# Patient Record
Sex: Female | Born: 1955 | Race: White | Hispanic: No | Marital: Married | Smoking: Former smoker
Health system: Southern US, Community
[De-identification: ages and names within clinical notes are randomized; demographics above are authoritative.]

## PROBLEM LIST (undated history)

## (undated) DIAGNOSIS — M5137 Other intervertebral disc degeneration, lumbosacral region: Secondary | ICD-10-CM

## (undated) DIAGNOSIS — I10 Essential (primary) hypertension: Secondary | ICD-10-CM

## (undated) DIAGNOSIS — G629 Polyneuropathy, unspecified: Secondary | ICD-10-CM

## (undated) DIAGNOSIS — C9590 Leukemia, unspecified not having achieved remission: Secondary | ICD-10-CM

## (undated) DIAGNOSIS — E119 Type 2 diabetes mellitus without complications: Secondary | ICD-10-CM

## (undated) HISTORY — PX: APPENDECTOMY: SHX54

## (undated) HISTORY — PX: BREAST SURGERY: SHX581

---

## 2019-07-27 ENCOUNTER — Other Ambulatory Visit: Payer: Self-pay

## 2019-07-27 ENCOUNTER — Emergency Department (HOSPITAL_COMMUNITY)
Admission: EM | Admit: 2019-07-27 | Discharge: 2019-07-27 | Disposition: A | Discharge status: Home / Self Care | Payer: No Typology Code available for payment source | Attending: Emergency Medicine | Admitting: Emergency Medicine

## 2019-07-27 ENCOUNTER — Encounter (HOSPITAL_COMMUNITY): Payer: Self-pay | Admitting: Emergency Medicine

## 2019-07-27 ENCOUNTER — Emergency Department (HOSPITAL_COMMUNITY): Payer: No Typology Code available for payment source

## 2019-07-27 DIAGNOSIS — I10 Essential (primary) hypertension: Secondary | ICD-10-CM | POA: Diagnosis not present

## 2019-07-27 DIAGNOSIS — E119 Type 2 diabetes mellitus without complications: Secondary | ICD-10-CM | POA: Insufficient documentation

## 2019-07-27 DIAGNOSIS — C9190 Lymphoid leukemia, unspecified not having achieved remission: Secondary | ICD-10-CM | POA: Diagnosis not present

## 2019-07-27 DIAGNOSIS — Y9389 Activity, other specified: Secondary | ICD-10-CM | POA: Diagnosis not present

## 2019-07-27 DIAGNOSIS — Y92410 Unspecified street and highway as the place of occurrence of the external cause: Secondary | ICD-10-CM | POA: Diagnosis not present

## 2019-07-27 DIAGNOSIS — Y999 Unspecified external cause status: Secondary | ICD-10-CM | POA: Diagnosis not present

## 2019-07-27 DIAGNOSIS — Z87891 Personal history of nicotine dependence: Secondary | ICD-10-CM | POA: Insufficient documentation

## 2019-07-27 DIAGNOSIS — R079 Chest pain, unspecified: Secondary | ICD-10-CM | POA: Insufficient documentation

## 2019-07-27 HISTORY — DX: Essential (primary) hypertension: I10

## 2019-07-27 HISTORY — DX: Type 2 diabetes mellitus without complications: E11.9

## 2019-07-27 HISTORY — DX: Leukemia, unspecified not having achieved remission: C95.90

## 2019-07-27 HISTORY — DX: Other intervertebral disc degeneration, lumbosacral region: M51.37

## 2019-07-27 HISTORY — DX: Polyneuropathy, unspecified: G62.9

## 2019-07-27 LAB — COMPREHENSIVE METABOLIC PANEL
ALT: 23 U/L (ref 0–44)
AST: 25 U/L (ref 15–41)
Albumin: 4.4 g/dL (ref 3.5–5.0)
Alkaline Phosphatase: 106 U/L (ref 38–126)
Anion gap: 15 (ref 5–15)
BUN: 9 mg/dL (ref 8–23)
CO2: 26 mmol/L (ref 22–32)
Calcium: 10.6 mg/dL — ABNORMAL HIGH (ref 8.9–10.3)
Chloride: 100 mmol/L (ref 98–111)
Creatinine, Ser: 0.75 mg/dL (ref 0.44–1.00)
GFR calc Af Amer: >60 mL/min (ref >60)
GFR calc non Af Amer: >60 mL/min (ref >60)
Glucose, Bld: 141 mg/dL — ABNORMAL HIGH (ref 70–99)
Potassium: 4.7 mmol/L (ref 3.5–5.1)
Sodium: 141 mmol/L (ref 135–145)
Total Bilirubin: 0.5 mg/dL (ref 0.3–1.2)
Total Protein: 7 g/dL (ref 6.5–8.1)

## 2019-07-27 LAB — CBC WITH DIFFERENTIAL/PLATELET
Abs Immature Granulocytes: 0.11 10*3/uL — ABNORMAL HIGH (ref 0.00–0.07)
Basophils Absolute: 0.2 10*3/uL — ABNORMAL HIGH (ref 0.0–0.1)
Basophils Relative: 1 %
Eosinophils Absolute: 0.3 10*3/uL (ref 0.0–0.5)
Eosinophils Relative: 1 %
HCT: 44.2 % (ref 36.0–46.0)
Hemoglobin: 14.6 g/dL (ref 12.0–15.0)
Immature Granulocytes: 0 %
Lymphocytes Relative: 71 %
Lymphs Abs: 24 10*3/uL — ABNORMAL HIGH (ref 0.7–4.0)
MCH: 33 pg (ref 26.0–34.0)
MCHC: 33 g/dL (ref 30.0–36.0)
MCV: 99.8 fL (ref 80.0–100.0)
Monocytes Absolute: 0.9 10*3/uL (ref 0.1–1.0)
Monocytes Relative: 3 %
Neutro Abs: 7.9 10*3/uL — ABNORMAL HIGH (ref 1.7–7.7)
Neutrophils Relative %: 24 %
Platelets: 227 10*3/uL (ref 150–400)
RBC: 4.43 MIL/uL (ref 3.87–5.11)
RDW: 11.9 % (ref 11.5–15.5)
WBC Morphology: >10
WBC: 33.3 10*3/uL — ABNORMAL HIGH (ref 4.0–10.5)
nRBC: 0.1 % (ref 0.0–0.2)

## 2019-07-27 MED ORDER — IOHEXOL 300 MG/ML  SOLN
75.0000 mL | Freq: Once | INTRAMUSCULAR | Status: AC | PRN
  Administered 2019-07-27: 75 mL via INTRAVENOUS

## 2019-07-27 MED ORDER — ACETAMINOPHEN 325 MG PO TABS
650.0000 mg | ORAL_TABLET | Freq: Four times a day (QID) | ORAL | Status: DC | PRN
  Administered 2019-07-27: 650 mg via ORAL
  Filled 2019-07-27: qty 2

## 2019-07-27 NOTE — ED Notes (Signed)
Patient transported to CT 
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
## 2019-07-27 NOTE — Discharge Instructions (Addendum)
Please apply the Neosporin twice daily on the abrasion on your left back.  Please use Tylenol Motrin for any pain.  CT read:  "1. Abnormally increased number of mildly enlarged bilateral lower neck, supraclavicular, subpectoral, upper abdominal, and axillary lymph nodes, suspicious for lymphoproliferative disorder such as lymphoma or metastatic disease. Questionable prominence of the upper spleen. 2. Several small pulmonary nodules are present in both lungs, measuring up to 5 mm in diameter." This will need follow up in 12 months with a repeat CT.

## 2019-07-27 NOTE — ED Notes (Signed)
Pt's pain was unchanged with tylenol but states she is fine and can live with it, it's not that bad.
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
## 2019-07-31 LAB — PATHOLOGIST SMEAR REVIEW
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
